# Patient Record
Sex: Male | Born: 1960 | Race: White | Hispanic: No | Marital: Single | Smoking: Never smoker
Health system: Southern US, Community
[De-identification: ages and names within clinical notes are randomized; demographics above are authoritative.]

## PROBLEM LIST (undated history)

## (undated) DIAGNOSIS — I10 Essential (primary) hypertension: Secondary | ICD-10-CM

---

## 2008-07-12 ENCOUNTER — Emergency Department (HOSPITAL_COMMUNITY): Admission: EM | Admit: 2008-07-12 | Discharge: 2008-07-13 | Payer: Self-pay | Admitting: Family Medicine

## 2010-03-12 IMAGING — CT CT PELVIS W/ CM
2 of 5 series · 14 of 32 positions shown, 19 images · IV contrast (agent unspecified)
Comparison: None.

CT ABDOMEN

CLINICAL DATA: Left abdominal pain, fever and nausea.

CT ABDOMEN AND PELVIS WITH CONTRAST
TECHNIQUE: Multidetector CT imaging of the abdomen and pelvis was
performed using the standard protocol following bolus
administration of intravenous contrast.
Contrast: 100 ml 1mnipaque-QBB

[Series 2: routine abdomen · axial · 0.80mm/px · z∈[-468,-144]mm · 6 of 92 slices shown, 11 images]
[im 14/92  soft-tissue]
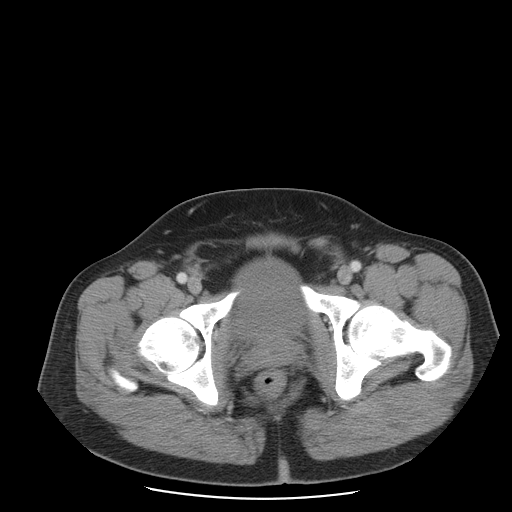
[im 14/92  bone]
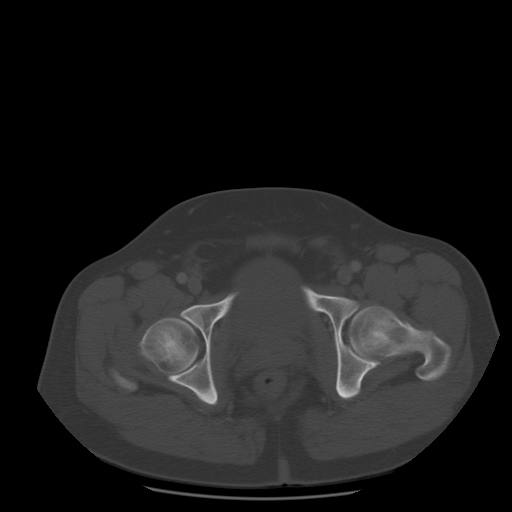
[im 27/92  soft-tissue]
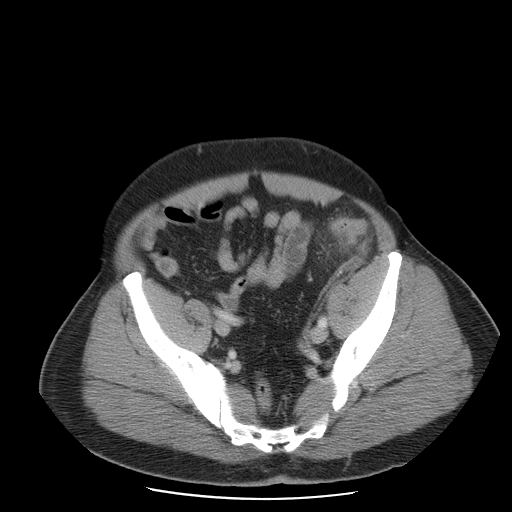
[im 40/92  soft-tissue]
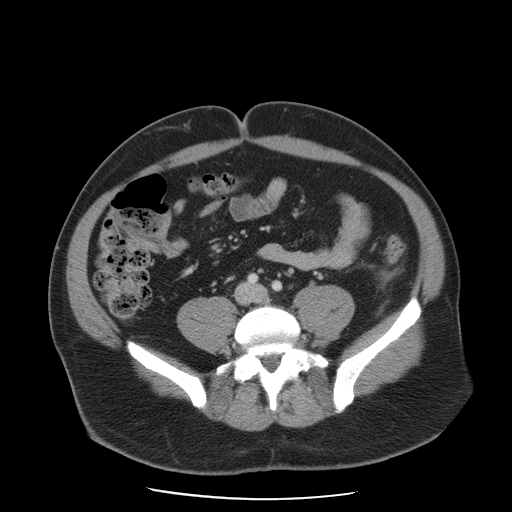
[im 40/92  lung]
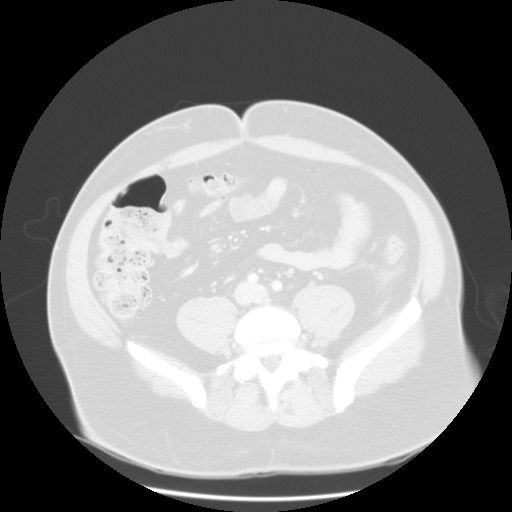
[im 53/92  soft-tissue]
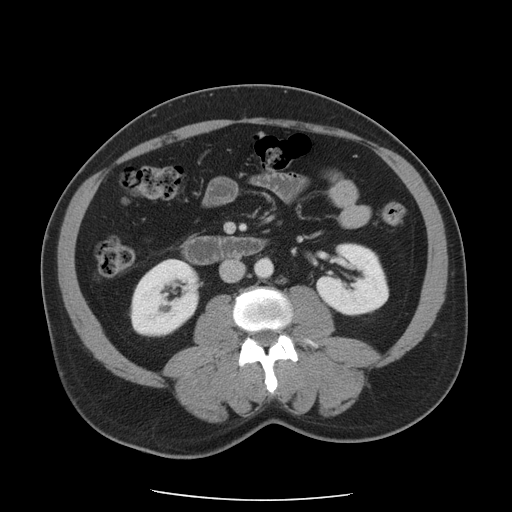
[im 53/92  lung]
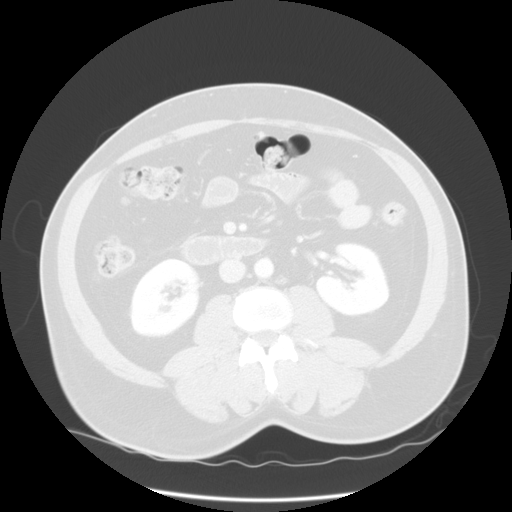
[im 66/92  soft-tissue]
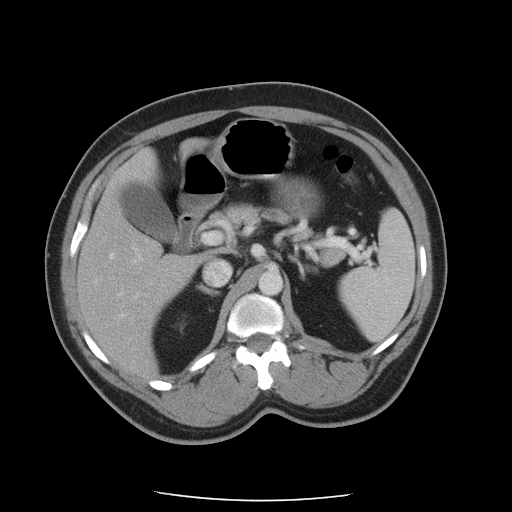
[im 66/92  lung]
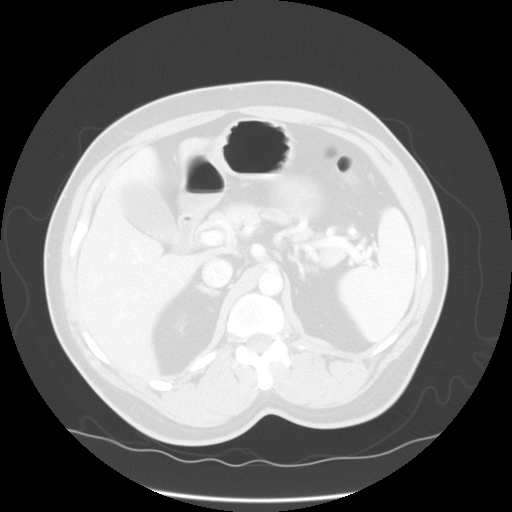
[im 79/92  soft-tissue]
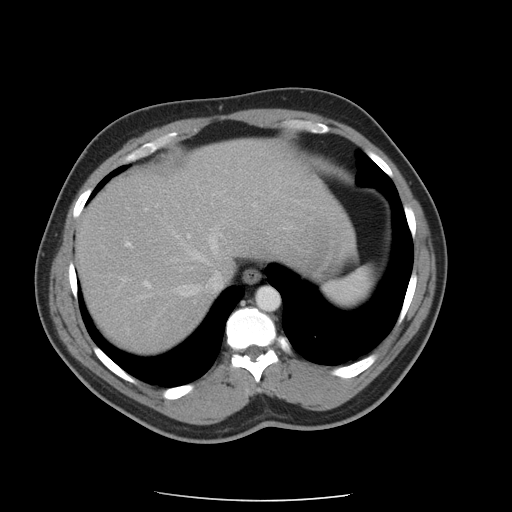
[im 79/92  lung]
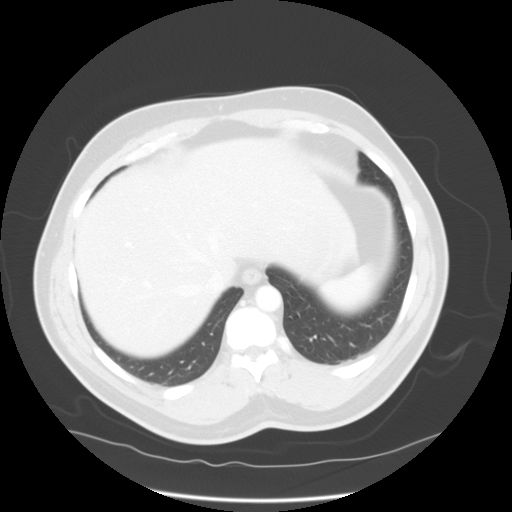

[Series 400: reformatted · sagittal · 0.90mm/px · 8 of 126 slices shown]
[im 14/126  soft-tissue]
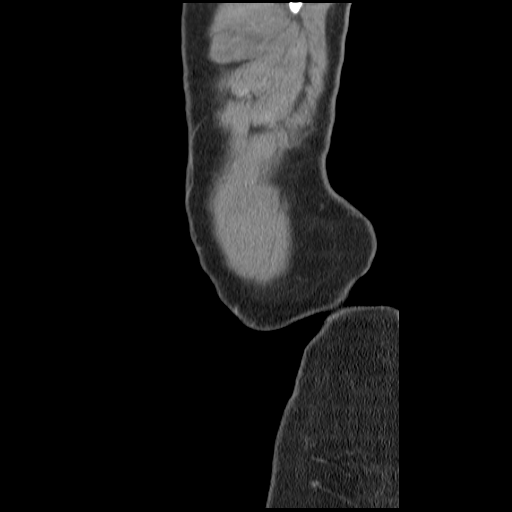
[im 28/126  soft-tissue]
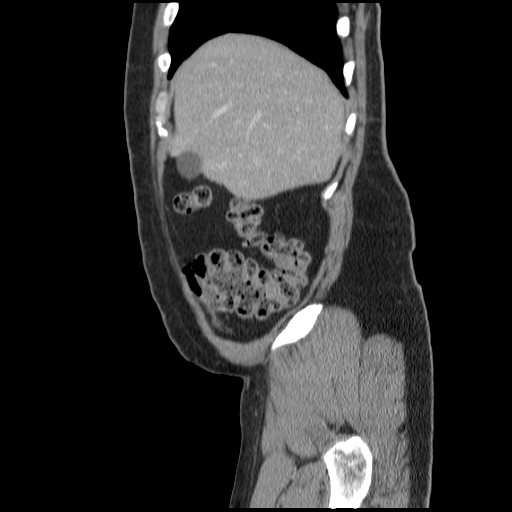
[im 42/126  soft-tissue]
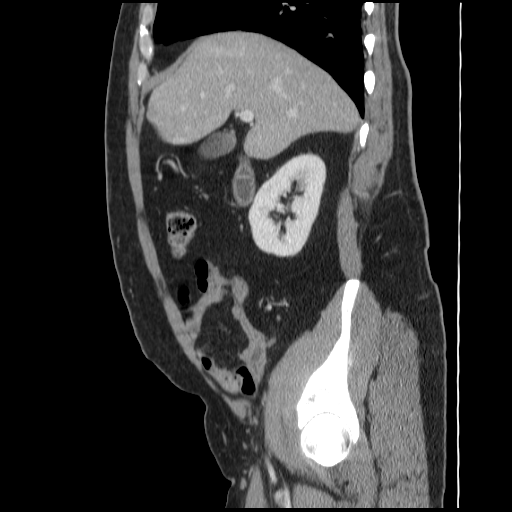
[im 56/126  soft-tissue]
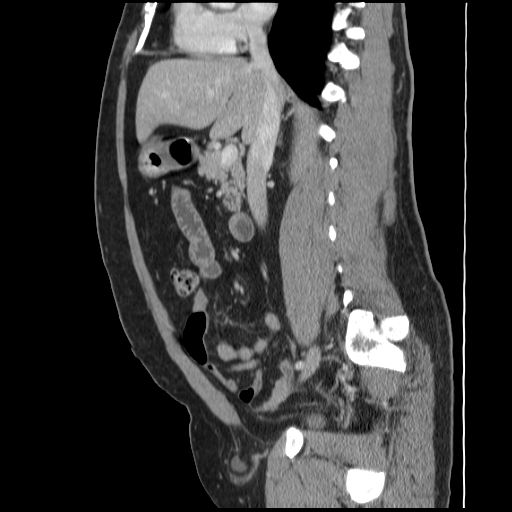
[im 70/126  soft-tissue]
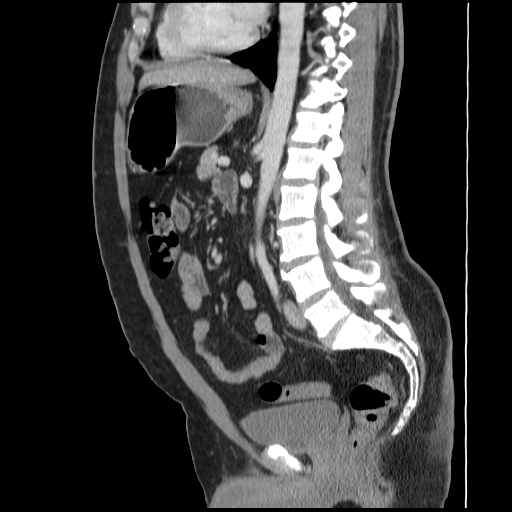
[im 84/126  soft-tissue]
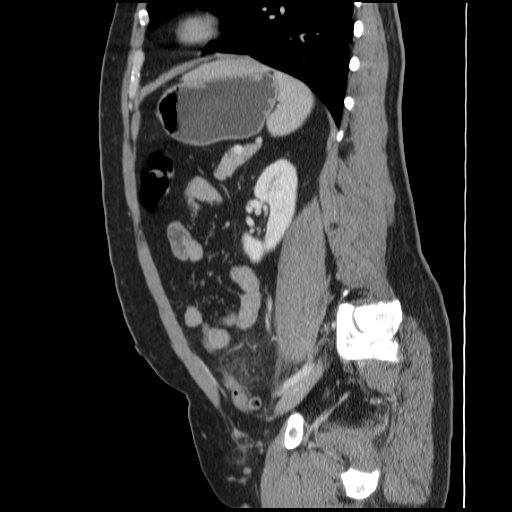
[im 98/126  soft-tissue]
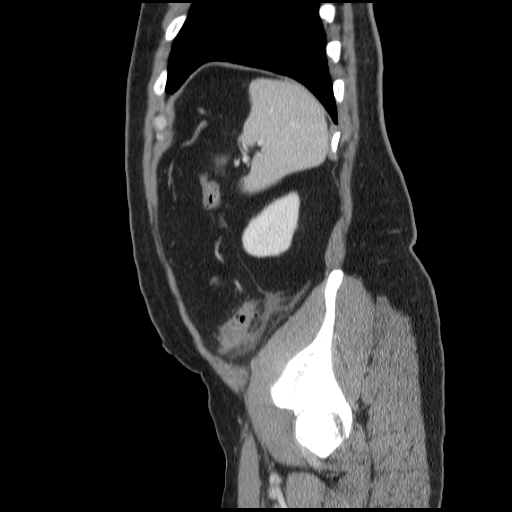
[im 112/126  soft-tissue]
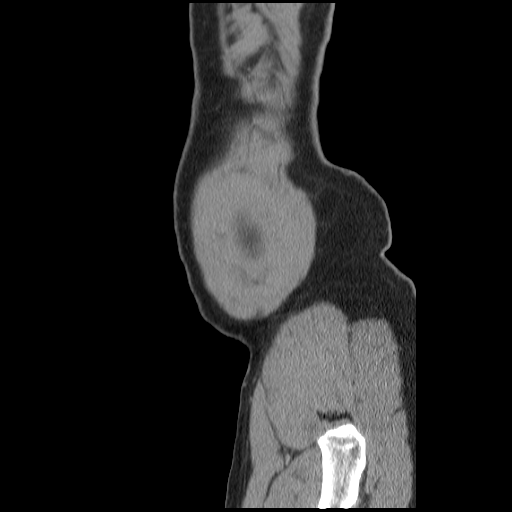

[14 of 32 positions shown; findings below may reference images not displayed]

FINDINGS: Diffuse fatty infiltration of the liver.  Unremarkable
spleen, pancreas, gallbladder, kidneys and adrenal glands.
Multiple colonic diverticula.  Mild soft tissue stranding adjacent
to the distal descending colon.  No free peritoneal fluid or air.
Clear lung bases.
IMPRESSION: 1.  Colonic diverticulosis.
2.  Mild soft tissue stranding adjacent to the distal descending
colon.

CT PELVIS
FINDINGS: Multiple sigmoid colon diverticula.  Concentric wall
thickening involving the proximal sigmoid colon with extensive
adjacent soft tissue stranding.  Minimal free peritoneal fluid.  No
fluid collections.  No free peritoneal air.  Unremarkable bones.
Bilateral inguinal hernias containing fat.  There is also some
fluid in the right inguinal hernia.
IMPRESSION: 1.  Proximal sigmoid colon diverticulosis and diverticulitis.
Follow-up sigmoidoscopy is recommended to exclude an underlying
neoplasm.
2.  Minimal free peritoneal fluid.
3.  Bilateral inguinal hernias containing fat.  Small amount of
fluid within the right inguinal hernia.

## 2010-07-12 LAB — DIFFERENTIAL
Basophils Absolute: 0.1 10*3/uL (ref 0.0–0.1)
Basophils Relative: 1 % (ref 0–1)
Lymphocytes Relative: 6 % — ABNORMAL LOW (ref 12–46)
Monocytes Absolute: 0.9 10*3/uL (ref 0.1–1.0)
Neutro Abs: 15.4 10*3/uL — ABNORMAL HIGH (ref 1.7–7.7)
Neutrophils Relative %: 89 % — ABNORMAL HIGH (ref 43–77)

## 2010-07-12 LAB — POCT URINALYSIS DIP (DEVICE)
Glucose, UA: NEGATIVE mg/dL
Ketones, ur: NEGATIVE mg/dL
Nitrite: NEGATIVE
Protein, ur: 30 mg/dL — AB
Specific Gravity, Urine: >=1.03 (ref 1.005–1.030)
Urobilinogen, UA: 1 mg/dL (ref 0.0–1.0)
pH: 5.5 (ref 5.0–8.0)

## 2010-07-12 LAB — CBC
HCT: 50.7 % (ref 39.0–52.0)
Hemoglobin: 17.8 g/dL — ABNORMAL HIGH (ref 13.0–17.0)
MCHC: 35 g/dL (ref 30.0–36.0)
MCV: 90.6 fL (ref 78.0–100.0)
Platelets: 223 10*3/uL (ref 150–400)
RBC: 5.6 MIL/uL (ref 4.22–5.81)
RDW: 13.1 % (ref 11.5–15.5)
WBC: 17.3 10*3/uL — ABNORMAL HIGH (ref 4.0–10.5)

## 2010-07-12 LAB — POCT I-STAT, CHEM 8
Calcium, Ion: 1.15 mmol/L (ref 1.12–1.32)
Chloride: 102 mEq/L (ref 96–112)
HCT: 53 % — ABNORMAL HIGH (ref 39.0–52.0)
Potassium: 4.5 mEq/L (ref 3.5–5.1)
Sodium: 138 mEq/L (ref 135–145)

## 2010-11-17 ENCOUNTER — Emergency Department (HOSPITAL_BASED_OUTPATIENT_CLINIC_OR_DEPARTMENT_OTHER)
Admission: EM | Admit: 2010-11-17 | Discharge: 2010-11-18 | Disposition: A | Discharge status: Home / Self Care | Payer: Worker's Compensation | Attending: Emergency Medicine | Admitting: Emergency Medicine

## 2010-11-17 ENCOUNTER — Encounter: Payer: Self-pay | Admitting: *Deleted

## 2010-11-17 DIAGNOSIS — S61011A Laceration without foreign body of right thumb without damage to nail, initial encounter: Secondary | ICD-10-CM

## 2010-11-17 DIAGNOSIS — W268XXA Contact with other sharp object(s), not elsewhere classified, initial encounter: Secondary | ICD-10-CM | POA: Insufficient documentation

## 2010-11-17 DIAGNOSIS — S61209A Unspecified open wound of unspecified finger without damage to nail, initial encounter: Secondary | ICD-10-CM | POA: Insufficient documentation

## 2010-11-17 DIAGNOSIS — Y9289 Other specified places as the place of occurrence of the external cause: Secondary | ICD-10-CM | POA: Insufficient documentation

## 2010-11-17 MED ORDER — TETANUS-DIPHTH-ACELL PERTUSSIS 5-2.5-18.5 LF-MCG/0.5 IM SUSP
0.5000 mL | Freq: Once | INTRAMUSCULAR | Status: AC
  Administered 2010-11-17: 0.5 mL via INTRAMUSCULAR
  Filled 2010-11-17: qty 0.5

## 2010-11-17 NOTE — ED Notes (Signed)
Soaking Pt. Thumb in solution NS and Betadine per Dr. Herma Carson order.

## 2010-11-17 NOTE — ED Provider Notes (Signed)
History     CSN: [id number] Arrival date & time: 11/17/2010  9:37 PM  Chief Complaint  Patient presents with  . Laceration   Patient is a 50 y.o. male presenting with skin laceration. The history is provided by the patient.  Laceration  The incident occurred less than 1 hour ago. Pain location: RIGHT THUMB. The laceration is 1 cm in size. The laceration mechanism was a a metal edge. The pain is at a severity of 1/10. The pain is mild. The pain has been improving since onset. He reports no foreign bodies present. His tetanus status is out of date.  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 LAST TD 10 PLUS YEARS. NO PIECES BROKE OFF SO FB NOT LIKELY.  History reviewed. No pertinent past medical history.  History reviewed. No pertinent past surgical history.  No family history on file.  History  Substance Use Topics  . Smoking status: Not on file  . Smokeless tobacco: Not on file  . Alcohol Use: Not on file      Review of Systems  Constitutional: Negative for fever.  HENT: Negative for congestion and neck pain.   Eyes: Negative for redness.  Respiratory: Negative for cough and shortness of breath.   Cardiovascular: Negative for chest pain.  Gastrointestinal: Negative for nausea, vomiting and abdominal pain.  Musculoskeletal: Negative for back pain.  Skin: Positive for wound.  Neurological: Negative for headaches.  Hematological: Does not bruise/bleed easily.    Physical Exam  BP 160/93  Pulse 84  Temp(Src) 99 F (37.2 C) (Oral)  Resp 18  SpO2 98%  Physical Exam  Nursing note and vitals reviewed. Constitutional: He is oriented to person, place, and time. He appears well-developed and well-nourished.  HENT:  Head: Normocephalic and atraumatic.  Eyes: Conjunctivae and EOM are normal. Pupils are equal, round, and reactive to light.  Neck: Normal range of motion. Neck supple.  Cardiovascular: Normal rate and regular rhythm.   No murmur  heard. Pulmonary/Chest: Effort normal and breath sounds normal.  Abdominal: Soft. Bowel sounds are normal.  Musculoskeletal: Normal range of motion.       NL EX FOR RIGHT THUMB DISTAL WITH 1 CM TRANSVERSE LACERATION SKIN EDGES APPROXIMATED NO ACTIVE BLEEDING GOOD FLEX EXTENSION AT DISTAL JOINT AND [place] INTACT.   Neurological: He is alert and oriented to person, place, and time. No cranial nerve deficit. He exhibits normal muscle tone. Coordination normal.  Skin: Skin is warm. No rash noted.    ED Course  LACERATION REPAIR Date/Time: 11/17/2010 11:14 PM Performed by: Shelda Jakes. Authorized by: Shelda Jakes Consent: Verbal consent obtained. Written consent obtained. Consent given by: patient Patient understanding: patient states understanding of the procedure being performed Patient identity confirmed: verbally with patient Time out: Immediately prior to procedure a "time out" was called to verify the correct patient, procedure, equipment, support staff and site/side marked as required. Body area: upper extremity Location details: right thumb Laceration length: 1 cm Foreign bodies: no foreign bodies Tendon involvement: none Nerve involvement: none Vascular damage: no Patient sedated: no Irrigation solution: saline Skin closure: glue Patient tolerance: Patient tolerated the procedure well with no immediate complications.    MDM TDP GIVEN AND DERMABOUND WORKED WELL.        Shelda Jakes, MD 11/17/10 [phone]

## 2010-11-17 NOTE — ED Notes (Signed)
Noted laceration on the R thumb.  Incident happened at work per Pt.  Deep 1/2 inch approx. Cut on the R thumb with controlled bleeding.

## 2010-11-18 NOTE — ED Notes (Signed)
Pt. Urine drug obtained at 0010 and sending fed ex.

## 2013-11-16 ENCOUNTER — Emergency Department (INDEPENDENT_AMBULATORY_CARE_PROVIDER_SITE_OTHER)
Admission: EM | Admit: 2013-11-16 | Discharge: 2013-11-16 | Disposition: A | Discharge status: Nursing Home (Includes ICF) | Payer: PRIVATE HEALTH INSURANCE | Source: Home / Self Care | Attending: Family Medicine | Admitting: Family Medicine

## 2013-11-16 ENCOUNTER — Encounter (HOSPITAL_COMMUNITY): Payer: Self-pay | Admitting: Emergency Medicine

## 2013-11-16 ENCOUNTER — Emergency Department (HOSPITAL_COMMUNITY)
Admission: EM | Admit: 2013-11-16 | Discharge: 2013-11-16 | Disposition: A | Discharge status: Home / Self Care | Payer: PRIVATE HEALTH INSURANCE | Attending: Emergency Medicine | Admitting: Emergency Medicine

## 2013-11-16 ENCOUNTER — Emergency Department (HOSPITAL_COMMUNITY): Payer: PRIVATE HEALTH INSURANCE

## 2013-11-16 DIAGNOSIS — I10 Essential (primary) hypertension: Secondary | ICD-10-CM

## 2013-11-16 DIAGNOSIS — R079 Chest pain, unspecified: Secondary | ICD-10-CM

## 2013-11-16 DIAGNOSIS — R0789 Other chest pain: Secondary | ICD-10-CM | POA: Insufficient documentation

## 2013-11-16 DIAGNOSIS — R0602 Shortness of breath: Secondary | ICD-10-CM | POA: Insufficient documentation

## 2013-11-16 DIAGNOSIS — I491 Atrial premature depolarization: Secondary | ICD-10-CM

## 2013-11-16 HISTORY — DX: Essential (primary) hypertension: I10

## 2013-11-16 LAB — CBC WITH DIFFERENTIAL/PLATELET
BASOS PCT: 1 % (ref 0–1)
Basophils Absolute: 0.1 10*3/uL (ref 0.0–0.1)
EOS ABS: 0.2 10*3/uL (ref 0.0–0.7)
EOS PCT: 2 % (ref 0–5)
HCT: 43 % (ref 39.0–52.0)
HEMOGLOBIN: 15.5 g/dL (ref 13.0–17.0)
Lymphocytes Relative: 26 % (ref 12–46)
Lymphs Abs: 1.8 10*3/uL (ref 0.7–4.0)
MCH: 31.1 pg (ref 26.0–34.0)
MCHC: 36 g/dL (ref 30.0–36.0)
MCV: 86.3 fL (ref 78.0–100.0)
MONOS PCT: 10 % (ref 3–12)
Monocytes Absolute: 0.7 10*3/uL (ref 0.1–1.0)
NEUTROS PCT: 61 % (ref 43–77)
Neutro Abs: 4.2 10*3/uL (ref 1.7–7.7)
PLATELETS: 230 10*3/uL (ref 150–400)
RBC: 4.98 MIL/uL (ref 4.22–5.81)
RDW: 13.3 % (ref 11.5–15.5)
WBC: 6.9 10*3/uL (ref 4.0–10.5)

## 2013-11-16 LAB — COMPREHENSIVE METABOLIC PANEL
ALBUMIN: 3.5 g/dL (ref 3.5–5.2)
ALK PHOS: 82 U/L (ref 39–117)
ALT: 41 U/L (ref 0–53)
ANION GAP: 11 (ref 5–15)
AST: 32 U/L (ref 0–37)
BUN: 10 mg/dL (ref 6–23)
CALCIUM: 8.6 mg/dL (ref 8.4–10.5)
CO2: 23 mEq/L (ref 19–32)
Chloride: 108 mEq/L (ref 96–112)
Creatinine, Ser: 1.01 mg/dL (ref 0.50–1.35)
GFR calc non Af Amer: 84 mL/min — ABNORMAL LOW (ref >90)
Glucose, Bld: 97 mg/dL (ref 70–99)
POTASSIUM: 3.9 meq/L (ref 3.7–5.3)
Sodium: 142 mEq/L (ref 137–147)
TOTAL PROTEIN: 6.3 g/dL (ref 6.0–8.3)
Total Bilirubin: 0.8 mg/dL (ref 0.3–1.2)

## 2013-11-16 LAB — TROPONIN I: Troponin I: <0.3 ng/mL (ref <0.30)

## 2013-11-16 MED ORDER — ASPIRIN 81 MG PO CHEW
CHEWABLE_TABLET | ORAL | Status: AC
  Filled 2013-11-16: qty 4

## 2013-11-16 MED ORDER — SODIUM CHLORIDE 0.9 % IV SOLN
Freq: Once | INTRAVENOUS | Status: AC
  Administered 2013-11-16: 18:00:00 via INTRAVENOUS

## 2013-11-16 MED ORDER — NITROGLYCERIN 0.4 MG SL SUBL
SUBLINGUAL_TABLET | SUBLINGUAL | Status: AC
  Filled 2013-11-16: qty 1

## 2013-11-16 MED ORDER — ASPIRIN 81 MG PO CHEW
324.0000 mg | CHEWABLE_TABLET | Freq: Once | ORAL | Status: AC
  Administered 2013-11-16: 324 mg via ORAL

## 2013-11-16 MED ORDER — NITROGLYCERIN 0.4 MG SL SUBL
0.4000 mg | SUBLINGUAL_TABLET | SUBLINGUAL | Status: DC | PRN
  Administered 2013-11-16: 0.4 mg via SUBLINGUAL

## 2013-11-16 NOTE — ED Notes (Signed)
Patient reports that fluid was intitiated at urgent care.

## 2013-11-16 NOTE — ED Notes (Addendum)
[facility] intermittent left sided CP that is sharp in nature. Pt reports the pain radiates into his left shoulder blade. Pt reports it usually lasts only approx 1 minute and today it lasted  approx 3 minutes. Pt reports the pain in his chest is sharp and intermittent and the scapula pain is throbbing and can last all day. Pt receive d324 of ASA and 2 nitros PTA. Pt reports associated symptoms of SOB with the pain. Denies any SOB at this time. 12 lead shows NSR with frequent PACs. Pt A&Ox4, resp e/u, and skin warm and dry.

## 2013-11-16 NOTE — ED Notes (Signed)
[facility]

## 2013-11-16 NOTE — ED Notes (Signed)
Denny PeonErin, PA-C explains to the patient that another timed troponin will need to be collected at 10pm and resulted before being discharged home.

## 2013-11-16 NOTE — ED Provider Notes (Signed)
CSN: [id number]     Arrival date & time 11/16/13  1835 History   First MD Initiated Contact with Patient 11/16/13 1918     Chief Complaint  Patient presents with  . Chest Pain     (Consider location/radiation/quality/duration/timing/severity/associated sxs/prior Treatment) HPI Pt is a 53yo male with hx of HTN sent to ED from urgent care for further evaluation of chest pain with exertion.  Pt states today he was jogging around 3:15 PM today when he started to have left sided chest wall pain that radiated into left shoulder blade and down left sided ribs, pain is 2/10 at worst, associated with some SOB, pain resolves within 5-10 minutes of rest.  Pt also reports associated lightheadedness.  Pt reports hx of similar intermittent symptoms over the last several months, always with exertion, even during work where pt has to perform heaving lifting at times. Pt has not f/u with a PCP in 20-30 years and does not take any daily medications. Pt was given 324mg  ASA and 2 nitro in urgent care PTA to ED.  Pain and SOB have resolved.  Denies palpitations, headache or diaphoresis.  Reports father has hx of afib, and both parents have hx of diabetes.    Denies hx of smoking.   Past Medical History  Diagnosis Date  . Hypertension    History reviewed. No pertinent past surgical history. Family History  Problem Relation Age of Onset  . Diabetes Mother   . Diabetes Father   . Atrial fibrillation Father   . Cancer Father     prostate   History  Substance Use Topics  . Smoking status: Never Smoker   . Smokeless tobacco: Never Used  . Alcohol Use: No    Review of Systems  Constitutional: Negative for fever and chills.  HENT: Negative for congestion and sore throat.   Respiratory: Positive for shortness of breath ( with the pain). Negative for cough, wheezing and stridor.   Cardiovascular: Positive for chest pain ( on exertion). Negative for palpitations and leg swelling.  Gastrointestinal: Negative  for nausea, vomiting, abdominal pain and diarrhea.  All other systems reviewed and are negative.     Allergies  Sulfa antibiotics  Home Medications   Prior to Admission medications   Medication Sig Start Date End Date Taking? Authorizing Provider  ibuprofen (ADVIL,MOTRIN) 200 MG tablet Take 400 mg by mouth every 6 (six) hours as needed for moderate pain.   Yes Historical Provider, MD  neomycin-bacitracin-polymyxin (NEOSPORIN) OINT Apply 1 application topically as needed for wound care.   Yes Historical Provider, MD   BP 156/84  Pulse 58  Temp(Src) 98.4 F (36.9 C) (Oral)  Resp 19  SpO2 98% Physical Exam  Nursing note and vitals reviewed. Constitutional: He appears well-developed and well-nourished.  Pt lying comfortably in exam bed, NAD.   HENT:  Head: Normocephalic and atraumatic.  Eyes: Conjunctivae are normal. No scleral icterus.  Neck: Normal range of motion. Neck supple.  Cardiovascular: Normal rate, regular rhythm and normal heart sounds.   Regular rate and rhythm  Pulmonary/Chest: Effort normal and breath sounds normal. No respiratory distress. He has no wheezes. He has no rales. He exhibits no tenderness.  No respiratory distress, able to speak in full sentences w/o difficulty. Lungs: CTAB. No chest wall tenderness.  Abdominal: Soft. Bowel sounds are normal. He exhibits no distension and no mass. There is no tenderness. There is no rebound and no guarding.  Soft, non-distended, non-tender.  Musculoskeletal: Normal range of motion.  Neurological: He is alert.  Skin: Skin is warm and dry.    ED Course  Procedures (including critical care time) Labs Review Labs Reviewed  COMPREHENSIVE METABOLIC PANEL - Abnormal; Notable for the following:    GFR calc non Af Amer 84 (*)    All other components within normal limits  CBC WITH DIFFERENTIAL  TROPONIN I  TROPONIN I    Imaging Review Dg Chest 2 View  11/16/2013   CLINICAL DATA:  Left-sided chest pain radiating to  the LEFT shoulder. Initial encounter.  EXAM: CHEST  2 VIEW  COMPARISON:  None.  FINDINGS: Cardiopericardial silhouette within normal limits. Mediastinal contours normal. Trachea midline. No airspace disease or effusion. Subsegmental atelectasis or scarring is present within the lingula. Partially radiopaque monitoring buttons project over the chest.  IMPRESSION: No active cardiopulmonary disease.   Electronically Signed   By: Andreas Newport M.D.   On: 11/16/2013 19:45     EKG Interpretation   Date/Time:  Monday November 16 2013 18:47:04 EDT Ventricular Rate:  74 PR Interval:  193 QRS Duration: 88 QT Interval:  386 QTC Calculation: 428 R Axis:   0 Text Interpretation:  Sinus rhythm Atrial premature complexes in couplets  Sinus pause No significant change since last tracing Confirmed by HARRISON   MD, FORREST ([id number]) on 11/16/2013 7:12:56 PM      MDM   Final diagnoses:  Chest pain on exertion    Pt is a 53yo male sent to ED for further evaluation of intermittent chest pain with exertion x several months.  Urgent care is concerned for NSTEMI. Pt given 324mg  ASA and 2 nitro prior to arrival to ED.  Symptoms resolved upon arrival to ED.  Pt is PERC negative.  Labs: WNL, including troponin negative for elevation.   CXR: no active cardiopulmonary disease.  EKG: no significant change since last tracing.  Discussed pt with Dr. Romeo Apple who also examined pt. Will get delta troponin, if negative, pt may f/u outpatient with cardiology. Delta troponin: negative for elevation. Pt has denied CP while in ED. Not concerned for pneumonia, pneumothorax, ACS, or other emergent process taking place at this time. Vitals: unremarkable, will discharge pt home to f/u with PCP and Cardiology, resources provided for pt to establish care with [facility]. Return precautions provided. Pt verbalized understanding and agreement with tx plan.     Junius Finner, PA-C 11/16/13 2329

## 2013-11-16 NOTE — ED Notes (Signed)
Iv  Ns  tko   18  Angio   r  Hand         Placed  On  Cardiac  Monitor  And  Nasal o2  At  2  l  /  Min

## 2013-11-16 NOTE — ED Notes (Addendum)
Pt reports  l  Sided   Chest    Chest  Pain  Off  And  On       For  sev  Months    He  Reports  The  Pain radiates  To      l  Shoulder           Area   Right  Now  The  Pain is  About   A  1              Skin is  Warm  And  Dry  Pt is  Awake  And  laert     Was  Slightly  Dizzy  As  Well

## 2013-11-16 NOTE — ED Notes (Signed)
Erin, PA-C, at the bedside.  

## 2013-11-16 NOTE — ED Notes (Signed)
Clarified with Denny PeonErin, PA-C, about scheduled troponin for 10pm, with last time of draw reviewed.  She allows draw to be collected at 10pm.

## 2013-11-16 NOTE — ED Provider Notes (Signed)
CSN: [id number]     Arrival date & time 11/16/13  1652 History   First MD Initiated Contact with Patient 11/16/13 1722     Chief Complaint  Patient presents with  . Chest Pain   (Consider location/radiation/quality/duration/timing/severity/associated sxs/prior Treatment) HPI  CP started off and on for several months. L chest wall and radiates to L shoulder blade and down around L ribs. Comes on w/ exertion. Pt has to rest in order to get it to stop after 5-10 min.  Episode today occurred at 3:15. Started 10-15 min into jog. Associated w/ lightheadedness Denies SOB, palpitations, HA, diaphoresis. Does not take any medications. Still w/ dull ache in L shoulder blade.    Past Medical History  Diagnosis Date  . Hypertension    History reviewed. No pertinent past surgical history. Family History  Problem Relation Age of Onset  . Diabetes Mother   . Diabetes Father   . Atrial fibrillation Father   . Cancer Father     prostate   History  Substance Use Topics  . Smoking status: Not on file  . Smokeless tobacco: Not on file  . Alcohol Use: No    Review of Systems Per HPI with all other pertinent systems negative.  ] Allergies  Sulfa antibiotics  Home Medications   Prior to Admission medications   Not on File   BP 170/111  Pulse 83  Resp 18  SpO2 99% Physical Exam  Constitutional: He is oriented to person, place, and time. He appears well-developed and well-nourished. No distress.  HENT:  Head: Normocephalic and atraumatic.  Eyes: Pupils are equal, round, and reactive to light.  Neck: Normal range of motion. Neck supple.  Cardiovascular: Normal rate, regular rhythm, normal heart sounds and intact distal pulses.  Exam reveals no gallop and no friction rub.   Pulmonary/Chest: Effort normal. No respiratory distress. He has no wheezes. He has no rales. He exhibits no tenderness.  Abdominal: Soft.  Musculoskeletal: Normal range of motion. He exhibits no tenderness.   Neurological: He is alert and oriented to person, place, and time. No cranial nerve deficit.  Skin: Skin is warm and dry. He is not diaphoretic.  Psychiatric: He has a normal mood and affect. His behavior is normal. Judgment and thought content normal.    ED Course  Procedures (including critical care time) Labs Review Labs Reviewed - No data to display  Imaging Review No results found.   MDM   1. Chest pain on exertion   2. Essential hypertension   3. PAC (premature atrial contraction)    52yo M w/ exertional CP. [facility] vs [facility]ERD vs pleuritic vs MSK  Hypertensive urgency Strong fmhx of DM Pt w/o regular medical care EKG w/o signs of ischemia - PAC Pt w/ multiple risk factors and actively in pain and concern for NSTEMI Transfer to [facility] for further evaluation and CP r/o   Shelly Flattenavid Keyston Ardolino, MD Family Medicine 11/16/2013, 5:45 PM    Ozella Rocksavid J Atom Solivan, MD 11/16/13 [phone]

## 2013-11-16 NOTE — Discharge Instructions (Signed)

## 2013-11-17 NOTE — ED Provider Notes (Signed)
Medical screening examination/treatment/procedure(s) were conducted as a shared visit with non-physician practitioner(s) and myself.  I personally evaluated the patient during the encounter.   EKG Interpretation   Date/Time:  Monday November 16 2013 18:47:04 EDT Ventricular Rate:  74 PR Interval:  193 QRS Duration: 88 QT Interval:  386 QTC Calculation: 428 R Axis:   0 Text Interpretation:  Sinus rhythm Atrial premature complexes in couplets  Sinus pause No significant change since last tracing Confirmed by Jesscia Imm   MD, Kartel Wolbert ([id number]) on 11/16/2013 7:12:56 PM      I interviewed and examined the patient. Lungs are CTAB. Cardiac exam wnl. Abdomen soft. Few RF's for heart dis including HTN. Clinical picture not c/w [place] or PE. Pt asx here. He does not want admission. I think this is reasonable as he is asx here, neg delta trop, and low risk for MACE per HEART score. Will rec outpt workup.  Purvis SheffieldForrest Mahasin Riviere, MD 11/17/13 [phone]

## 2015-07-16 IMAGING — CR DG CHEST 2V
2 series · 2 of 2 positions shown · non-contrast
Comparison: None.

CLINICAL DATA: Left-sided chest pain radiating to the LEFT
shoulder. Initial encounter.

EXAM:
CHEST  2 VIEW

[w chest pa]
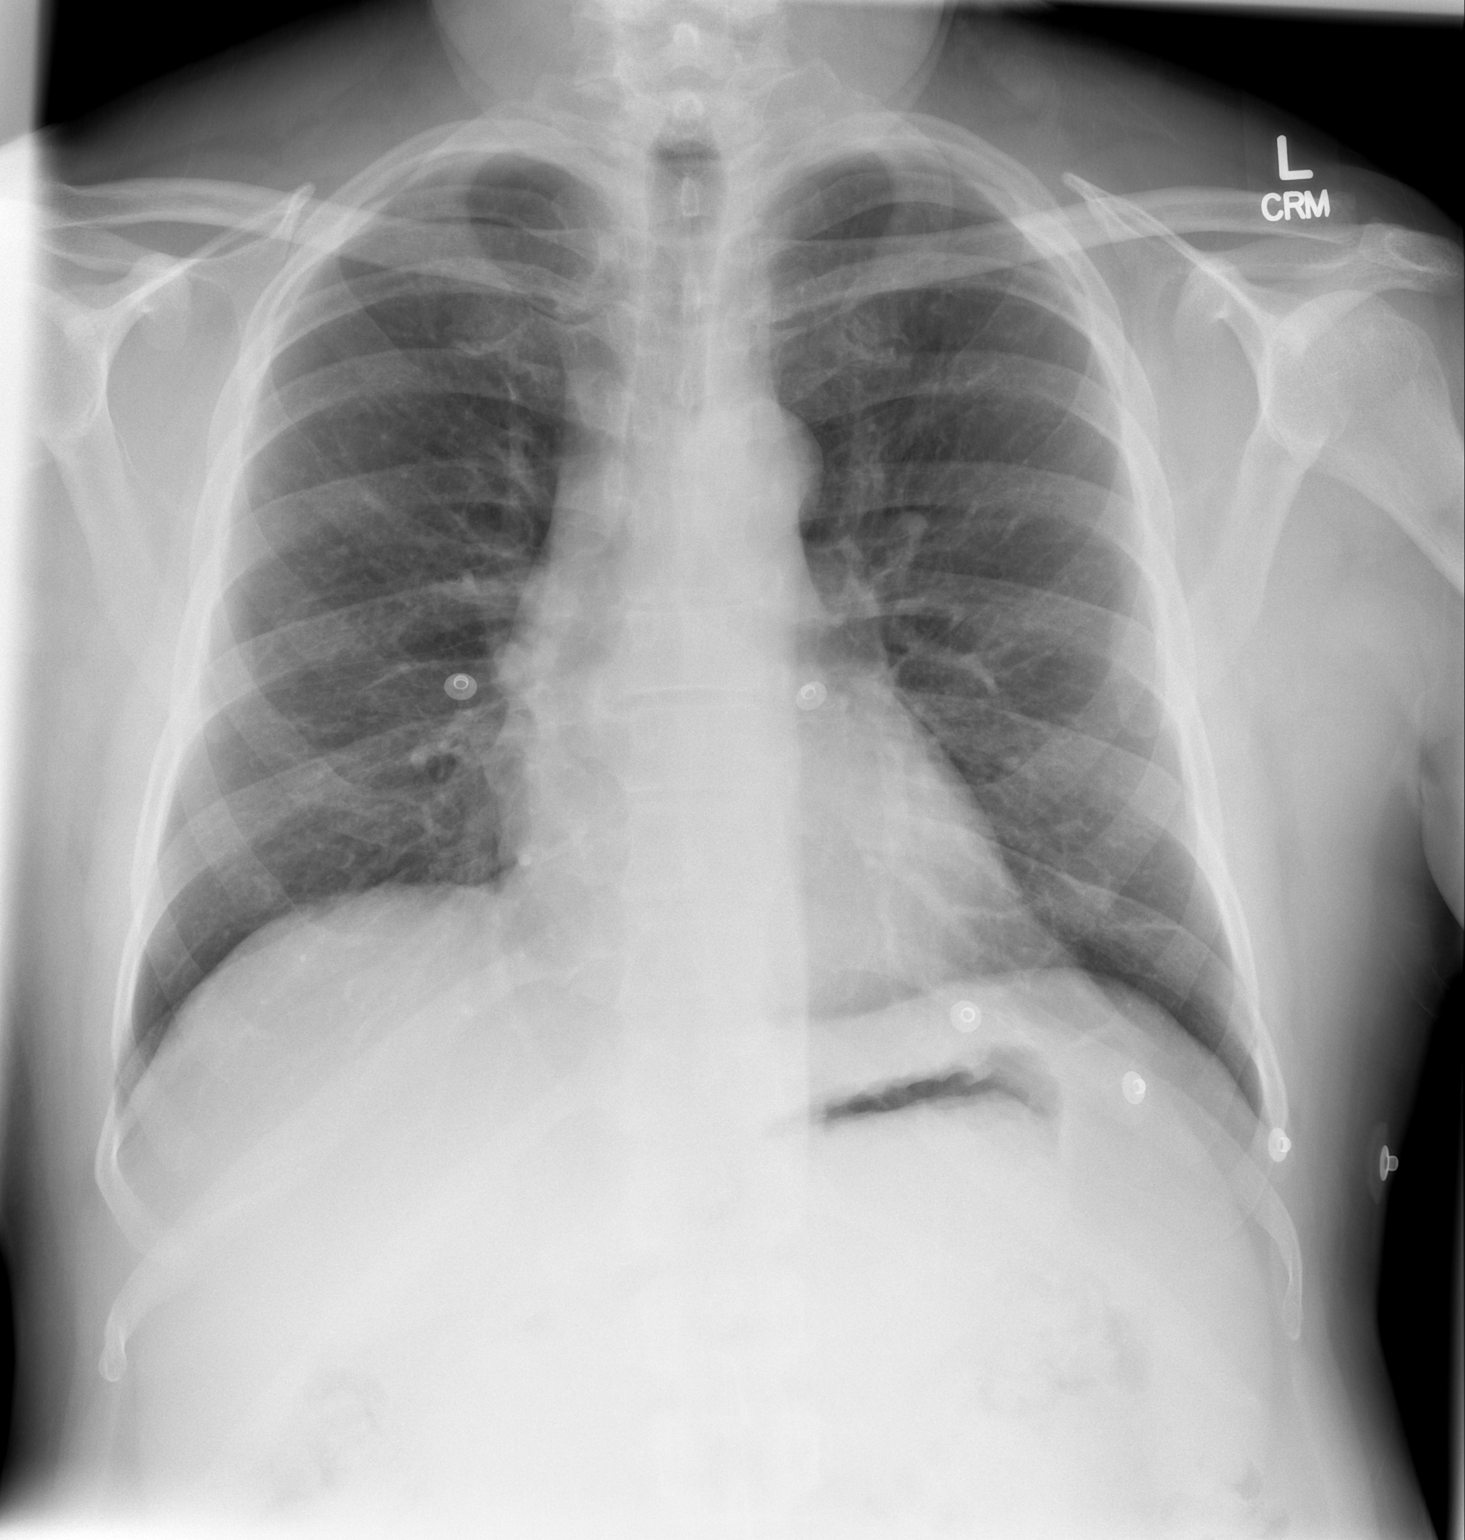

[w chest lat]
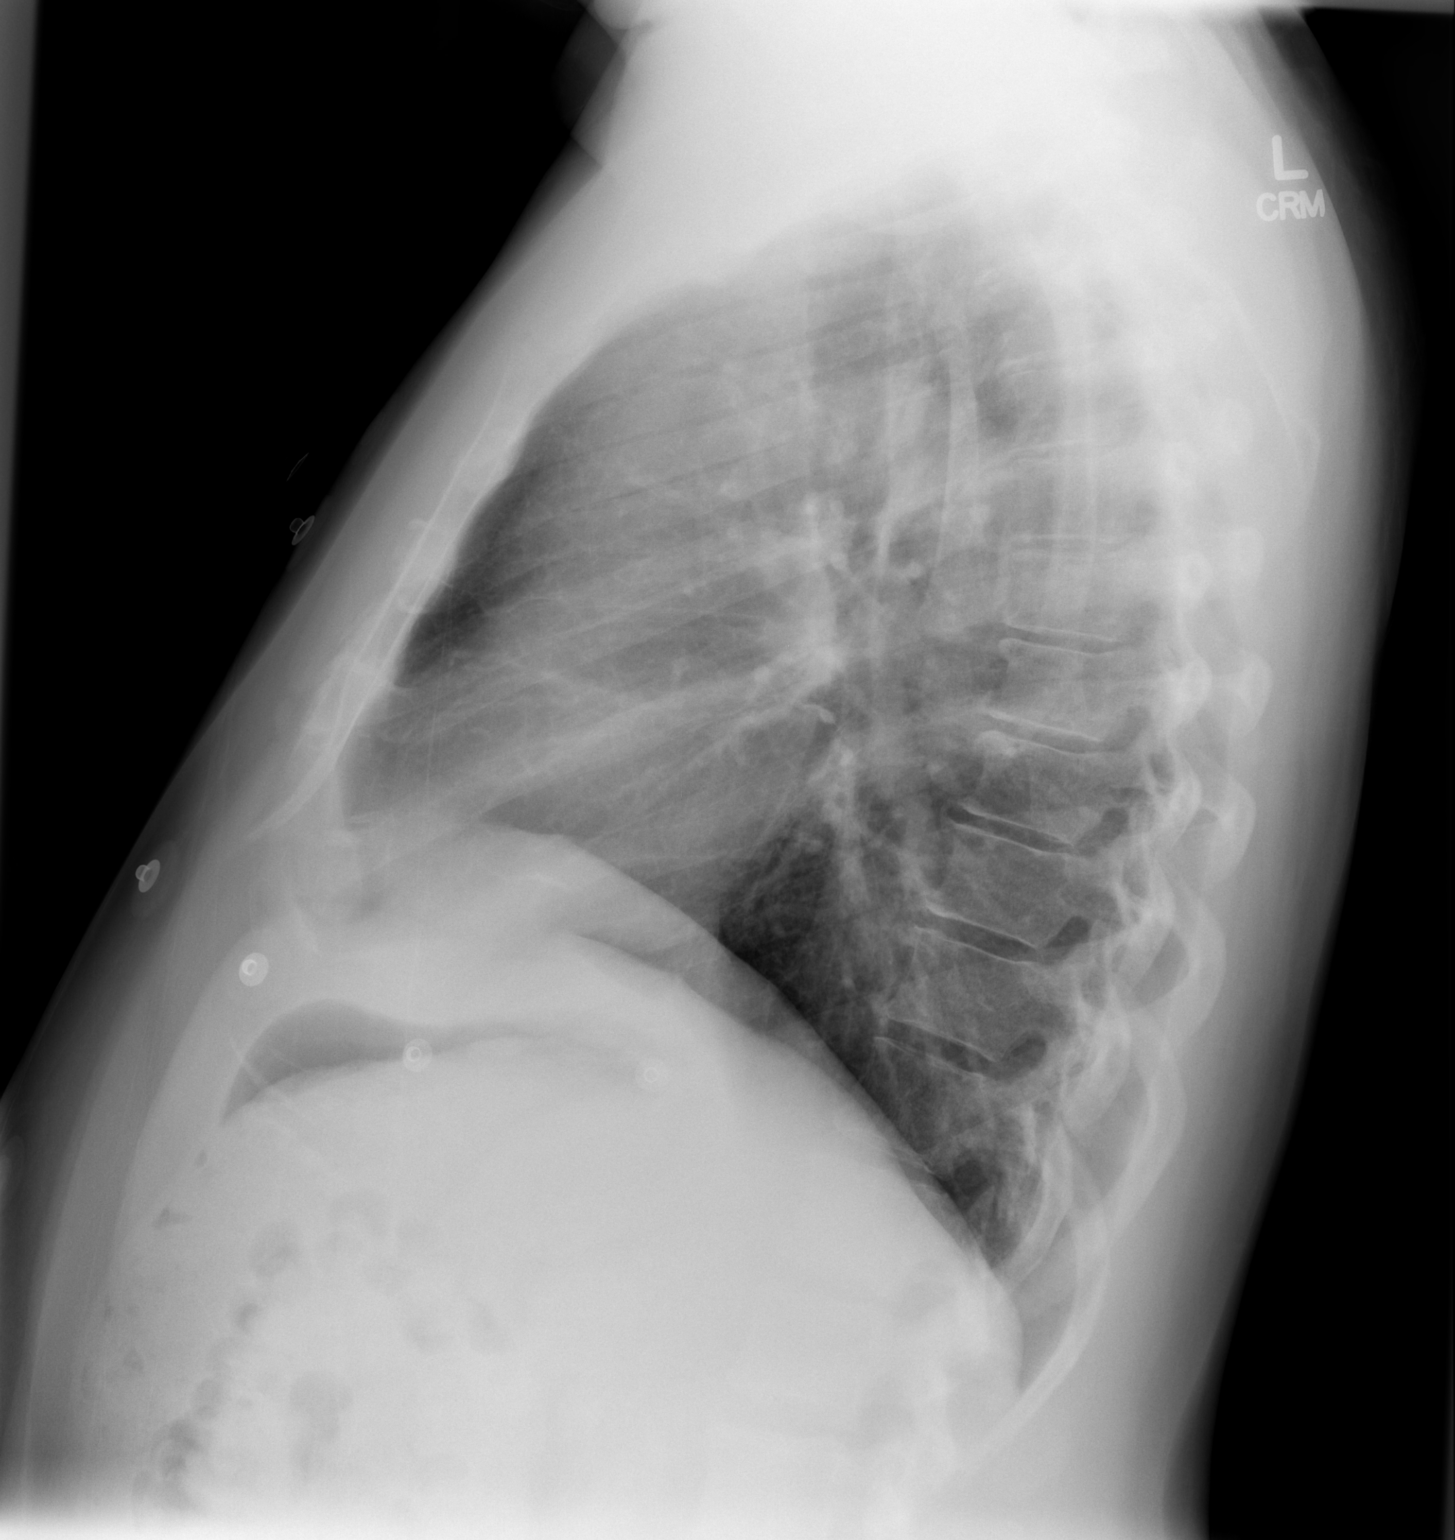

[2 of 2 positions shown; findings below may reference images not displayed]

FINDINGS: Cardiopericardial silhouette within normal limits. Mediastinal
contours normal. Trachea midline. No airspace disease or effusion.
Subsegmental atelectasis or scarring is present within the lingula.
Partially radiopaque monitoring buttons project over the chest.
IMPRESSION: No active cardiopulmonary disease.

## 2018-11-12 ENCOUNTER — Other Ambulatory Visit: Payer: Self-pay

## 2018-11-12 DIAGNOSIS — Z20822 Contact with and (suspected) exposure to covid-19: Secondary | ICD-10-CM

## 2018-11-13 LAB — NOVEL CORONAVIRUS, NAA: SARS-CoV-2, NAA: NOT DETECTED

## 2019-06-26 ENCOUNTER — Ambulatory Visit: Payer: PRIVATE HEALTH INSURANCE | Attending: Internal Medicine

## 2019-06-26 DIAGNOSIS — Z23 Encounter for immunization: Secondary | ICD-10-CM

## 2019-06-26 NOTE — Progress Notes (Signed)
   Covid-19 Vaccination Clinic  Name:  Tashon Capp    MRN: [id number] DOB: [birth year]  06/26/2019  Mr. Damron was observed post Covid-19 immunization for 15 minutes without incident. He was provided with Vaccine Information Sheet and instruction to access the V-Safe system.   Mr. Radigan was instructed to call 911 with any severe reactions post vaccine: Marland Kitchen Difficulty breathing  . Swelling of face and throat  . A fast heartbeat  . A bad rash all over body  . Dizziness and weakness   Immunizations Administered    Name Date Dose VIS Date Route   Pfizer COVID-19 Vaccine 06/26/2019  9:56 AM 0.3 mL 03/13/2019 Intramuscular   Manufacturer: [employer], [employer]   Lot: [id number]   NDC: [address]-8

## 2019-07-22 ENCOUNTER — Ambulatory Visit: Payer: PRIVATE HEALTH INSURANCE | Attending: Internal Medicine

## 2019-07-22 DIAGNOSIS — Z23 Encounter for immunization: Secondary | ICD-10-CM

## 2019-07-22 NOTE — Progress Notes (Signed)
   Covid-19 Vaccination Clinic  Name:  Alter Moss    MRN: [id number] DOB: 1960/06/21  07/22/2019  Mr. Cromie was observed post Covid-19 immunization for 15 minutes without incident. He was provided with Vaccine Information Sheet and instruction to access the V-Safe system.   Mr. Junker was instructed to call 911 with any severe reactions post vaccine: Marland Kitchen Difficulty breathing  . Swelling of face and throat  . A fast heartbeat  . A bad rash all over body  . Dizziness and weakness   Immunizations Administered    Name Date Dose VIS Date Route   Pfizer COVID-19 Vaccine 07/22/2019  9:19 AM 0.3 mL 05/27/2018 Intramuscular   Manufacturer: [employer], [employer]   Lot: [id number]   NDC: [address]-2

## 2022-11-22 ENCOUNTER — Other Ambulatory Visit: Payer: Self-pay

## 2022-11-22 ENCOUNTER — Emergency Department: Payer: No Typology Code available for payment source

## 2022-11-22 DIAGNOSIS — M25531 Pain in right wrist: Secondary | ICD-10-CM | POA: Diagnosis present

## 2022-11-22 DIAGNOSIS — M7011 Bursitis, right hand: Secondary | ICD-10-CM | POA: Diagnosis not present

## 2022-11-22 DIAGNOSIS — I10 Essential (primary) hypertension: Secondary | ICD-10-CM | POA: Insufficient documentation

## 2022-11-22 DIAGNOSIS — Y9389 Activity, other specified: Secondary | ICD-10-CM | POA: Diagnosis not present

## 2022-11-22 NOTE — ED Triage Notes (Signed)
R wrist pain x months. Reports burning pain since last night. No known trauma or injury. Pt ambulatory to triage. Alert and oriented. Pt breathing unlabored.

## 2022-11-23 ENCOUNTER — Emergency Department
Admission: EM | Admit: 2022-11-23 | Discharge: 2022-11-23 | Disposition: A | Discharge status: Home / Self Care | Payer: No Typology Code available for payment source | Attending: Emergency Medicine | Admitting: Emergency Medicine

## 2022-11-23 DIAGNOSIS — M7011 Bursitis, right hand: Secondary | ICD-10-CM

## 2022-11-23 DIAGNOSIS — M25531 Pain in right wrist: Secondary | ICD-10-CM

## 2022-11-23 MED ORDER — KETOROLAC TROMETHAMINE 60 MG/2ML IM SOLN
30.0000 mg | Freq: Once | INTRAMUSCULAR | Status: AC
  Administered 2022-11-23: 30 mg via INTRAMUSCULAR
  Filled 2022-11-23: qty 2

## 2022-11-23 MED ORDER — NAPROXEN 500 MG PO TABS: 500.0000 mg | ORAL_TABLET | Freq: Two times a day (BID) | ORAL | 0 refills | Status: AC

## 2022-11-23 MED ORDER — METHYLPREDNISOLONE 4 MG PO TBPK: ORAL_TABLET | ORAL | 0 refills | Status: AC

## 2022-11-23 NOTE — Discharge Instructions (Signed)
Wear wrist splint as needed for comfort.  Take Naprosyn twice daily for pain.  Take steroid taper as prescribed.  Return to the ER for worsening symptoms, increased swelling, numbness/tingling, weakness or other concerns.

## 2022-11-23 NOTE — ED Provider Notes (Signed)
[facility] Provider Note    Event Date/Time   First MD Initiated Contact with Patient 11/23/22 0038     (approximate)   History   Wrist Pain   HPI  Oak[place] Dirksen is a 62 y.o. male who presents to the ED from work at the [employer] with a chief complaint of nontraumatic right wrist pain since April of this year.  Reports increase in burning type pain since last night, mainly to ulnar wrist.  Reports lifting 10 pound frames with repetitive twisting and torquing like motions at work with 8-hour shifts.  Denies extremity weakness or numbness.  Voices no other complaints or injuries.     Past Medical History   Past Medical History:  Diagnosis Date   Hypertension      Active Problem List  There are no problems to display for this patient.    Past Surgical History  History reviewed. No pertinent surgical history.   Home Medications   Prior to Admission medications   Medication Sig Start Date End Date Taking? Authorizing Provider  methylPREDNISolone (MEDROL DOSEPAK) 4 MG TBPK tablet Take as directed 11/23/22  Yes Irean Hong, MD  naproxen (NAPROSYN) 500 MG tablet Take 1 tablet (500 mg total) by mouth 2 (two) times daily with a meal. 11/23/22  Yes Irean Hong, MD  ibuprofen (ADVIL,MOTRIN) 200 MG tablet Take 400 mg by mouth every 6 (six) hours as needed for moderate pain.    [provider]  neomycin-bacitracin-polymyxin (NEOSPORIN) OINT Apply 1 application topically as needed for wound care.    [provider]     Allergies  Sulfa antibiotics   Family History   Family History  Problem Relation Age of Onset   Diabetes Mother    Diabetes Father    Atrial fibrillation Father    Cancer Father        prostate     Physical Exam  Triage Vital Signs: ED Triage Vitals  Encounter Vitals Group     BP 11/22/22 2219 (!) 134/91     Systolic BP Percentile --      Diastolic BP Percentile --      Pulse Rate 11/22/22 2219 66      Resp 11/22/22 2219 18     Temp 11/22/22 2219 97.7 F (36.5 C)     Temp Source 11/22/22 2219 Oral     SpO2 11/22/22 2219 96 %     Weight 11/22/22 2220 210 lb (95.3 kg)     Height 11/22/22 2220 5\' 7"  (1.702 m)     Head Circumference --      Peak Flow --      Pain Score 11/22/22 2220 8     Pain Loc --      Pain Education --      Exclude from Growth Chart --     Updated Vital Signs: BP (!) 183/91   Pulse 68   Temp 97.7 F (36.5 C) (Oral)   Resp 18   Ht 5\' 7"  (1.702 m)   Wt 95.3 kg   SpO2 99%   BMI 32.89 kg/m    General: Awake, no distress.  CV:  Good peripheral perfusion.  Resp:  Normal effort.  Abd:  No distention.  Other:  Right wrist: No swelling or deformities noted.  Tender to palpation ulnar surface.  Increased pain on twisting motion.  2+ radial pulses.  Brisk, less than 5-second cap refill.   ED Results / Procedures / Treatments  Labs (all labs ordered are listed, but only abnormal results are displayed) Labs Reviewed - No data to display   EKG  None   RADIOLOGY I have independently visualized and interpreted patient's x-ray as well as noted the radiology interpretation:  Wrist x-ray: No acute osseous abnormality.  Small soft tissue FB noted; patient reports prior injury.  Official radiology report(s): DG Wrist Complete Right  Result Date: 11/22/2022 CLINICAL DATA:  Right wrist pain.  No known injury. EXAM: RIGHT WRIST - COMPLETE 3+ VIEW COMPARISON:  None Available. FINDINGS: Small radiopaque foreign body in the soft tissues overlying the 1st carpometacarpal joint. No acute bony abnormality. Specifically, no fracture, subluxation, or dislocation. IMPRESSION: No acute bony abnormality. Small soft tissue radiopaque foreign body overlying the 1st CMC joint. Electronically Signed   By: Charlett Nose M.D.   On: 11/22/2022 22:50     PROCEDURES:  Critical Care performed: No  Procedures   MEDICATIONS ORDERED IN ED: Medications  ketorolac (TORADOL)  injection 30 mg (has no administration in time range)     IMPRESSION / MDM / ASSESSMENT AND PLAN / ED COURSE  I reviewed the triage vital signs and the nursing notes.                             62 year old male presenting with nontraumatic right wrist pain times several months.  Placed on NSAIDs, steroid taper for bursitis.  Placed in Velcro wrist splint and patient will follow-up with orthopedics as needed.  Strict return precautions given.  Patient verbalizes understanding and agrees with plan of care.  Patient's presentation is most consistent with acute, uncomplicated illness.   FINAL CLINICAL IMPRESSION(S) / ED DIAGNOSES   Final diagnoses:  Right wrist pain  Bursitis of right wrist     Rx / DC Orders   ED Discharge Orders          Ordered    naproxen (NAPROSYN) 500 MG tablet  2 times daily with meals        11/23/22 0044    methylPREDNISolone (MEDROL DOSEPAK) 4 MG TBPK tablet        11/23/22 0044             Note:  This document was prepared using Dragon voice recognition software and may include unintentional dictation errors.   Irean Hong, MD 11/23/22 [id number]
